# Patient Record
Sex: Male | Born: 1990 | Race: Black or African American | Hispanic: No | Marital: Single | Smoking: Current every day smoker
Health system: Southern US, Community
[De-identification: ages and names within clinical notes are randomized; demographics above are authoritative.]

---
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
## 2012-10-18 ENCOUNTER — Encounter (HOSPITAL_COMMUNITY): Payer: Self-pay | Admitting: *Deleted

## 2012-10-18 ENCOUNTER — Observation Stay (HOSPITAL_COMMUNITY)
Admission: EM | Admit: 2012-10-18 | Discharge: 2012-10-18 | Disposition: A | Discharge status: Home / Self Care | Payer: MEDICAID | Attending: Surgery | Admitting: Surgery

## 2012-10-18 ENCOUNTER — Emergency Department (HOSPITAL_COMMUNITY): Payer: Self-pay

## 2012-10-18 DIAGNOSIS — S31109A Unspecified open wound of abdominal wall, unspecified quadrant without penetration into peritoneal cavity, initial encounter: Secondary | ICD-10-CM

## 2012-10-18 DIAGNOSIS — K921 Melena: Secondary | ICD-10-CM | POA: Insufficient documentation

## 2012-10-18 DIAGNOSIS — S31119A Laceration without foreign body of abdominal wall, unspecified quadrant without penetration into peritoneal cavity, initial encounter: Secondary | ICD-10-CM

## 2012-10-18 LAB — COMPREHENSIVE METABOLIC PANEL
ALT: 27 U/L (ref 0–53)
Albumin: 4.1 g/dL (ref 3.5–5.2)
Alkaline Phosphatase: 72 U/L (ref 39–117)
BUN: 13 mg/dL (ref 6–23)
CO2: 26 mEq/L (ref 19–32)
Calcium: 9.3 mg/dL (ref 8.4–10.5)
Creatinine, Ser: 1.14 mg/dL (ref 0.50–1.35)
GFR calc Af Amer: >90 mL/min (ref >90)
Glucose, Bld: 87 mg/dL (ref 70–99)
Sodium: 141 mEq/L (ref 135–145)
Total Bilirubin: 0.8 mg/dL (ref 0.3–1.2)

## 2012-10-18 LAB — CBC
MCV: 88.4 fL (ref 78.0–100.0)
RDW: 13.5 % (ref 11.5–15.5)

## 2012-10-18 LAB — POCT I-STAT, CHEM 8
Calcium, Ion: 1.18 mmol/L (ref 1.12–1.23)
Glucose, Bld: 90 mg/dL (ref 70–99)
Hemoglobin: 14.6 g/dL (ref 13.0–17.0)
Sodium: 145 mEq/L (ref 135–145)

## 2012-10-18 MED ORDER — ONDANSETRON HCL 4 MG/2ML IJ SOLN: 4.0000 mg | Freq: Four times a day (QID) | INTRAMUSCULAR | Status: DC | PRN

## 2012-10-18 MED ORDER — MORPHINE SULFATE 2 MG/ML IJ SOLN: 2.0000 mg | INTRAMUSCULAR | Status: DC | PRN

## 2012-10-18 MED ORDER — ONDANSETRON HCL 4 MG PO TABS: 4.0000 mg | ORAL_TABLET | Freq: Four times a day (QID) | ORAL | Status: DC | PRN

## 2012-10-18 MED ORDER — BACITRACIN-NEOMYCIN-POLYMYXIN OINTMENT TUBE
TOPICAL_OINTMENT | Freq: Two times a day (BID) | CUTANEOUS | Status: DC
  Filled 2012-10-18 (×2): qty 15

## 2012-10-18 MED ORDER — SODIUM CHLORIDE 0.9 % IV SOLN: INTRAVENOUS | Status: DC
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
## 2012-10-18 NOTE — ED Notes (Signed)
Report given to Amanda, RN
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
## 2013-04-27 ENCOUNTER — Emergency Department (HOSPITAL_COMMUNITY)
Admission: EM | Admit: 2013-04-27 | Discharge: 2013-04-27 | Disposition: A | Discharge status: Home / Self Care | Payer: Self-pay | Attending: Emergency Medicine | Admitting: Emergency Medicine

## 2013-04-27 ENCOUNTER — Encounter (HOSPITAL_COMMUNITY): Payer: Self-pay | Admitting: Emergency Medicine

## 2013-04-27 ENCOUNTER — Emergency Department (HOSPITAL_COMMUNITY): Admission: EM | Admit: 2013-04-27 | Discharge: 2013-04-27 | Discharge status: Against Medical Advice | Payer: Self-pay

## 2013-04-27 DIAGNOSIS — S0181XA Laceration without foreign body of other part of head, initial encounter: Secondary | ICD-10-CM

## 2013-04-27 DIAGNOSIS — Y92009 Unspecified place in unspecified non-institutional (private) residence as the place of occurrence of the external cause: Secondary | ICD-10-CM | POA: Insufficient documentation

## 2013-04-27 DIAGNOSIS — S01409A Unspecified open wound of unspecified cheek and temporomandibular area, initial encounter: Secondary | ICD-10-CM | POA: Insufficient documentation

## 2013-04-27 DIAGNOSIS — W01119A Fall on same level from slipping, tripping and stumbling with subsequent striking against unspecified sharp object, initial encounter: Secondary | ICD-10-CM | POA: Insufficient documentation

## 2013-04-27 DIAGNOSIS — Y9383 Activity, rough housing and horseplay: Secondary | ICD-10-CM | POA: Insufficient documentation

## 2013-04-27 DIAGNOSIS — F17200 Nicotine dependence, unspecified, uncomplicated: Secondary | ICD-10-CM | POA: Insufficient documentation

## 2013-04-27 MED ORDER — LIDOCAINE HCL (PF) 2 % IJ SOLN
10.0000 mL | Freq: Once | INTRAMUSCULAR | Status: AC
  Administered 2013-04-27: 10 mL via INTRADERMAL

## 2013-04-27 MED ORDER — LIDOCAINE HCL (PF) 2 % IJ SOLN
10.0000 mL | Freq: Once | INTRAMUSCULAR | Status: DC
  Filled 2013-04-27: qty 10

## 2013-04-27 MED ORDER — LIDOCAINE HCL (PF) 2 % IJ SOLN
INTRAMUSCULAR | Status: AC
  Filled 2013-04-27: qty 10

## 2013-04-27 NOTE — ED Notes (Signed)
Charting completed on wrong patient due to being given incorrect name by patient.

## 2013-04-27 NOTE — ED Provider Notes (Addendum)
CSN: [id number] Arrival date & time 04/27/13 0201   History   First MD Initiated Contact with Patient 04/27/13 0216  Chief Complaint   Patient presents with   .  Facial Laceration    HPI  Reports he was playing at the house, fell and struck his right cheek on the couch and a nail was sticking out. Reports laceration. No bleeding at this time. No trismus or malocclusion. Symptoms are mild. No neck pain. No head injury no LOC. Tetanus updated less than 5 years ago.  History reviewed. No pertinent past medical history.  History reviewed. No pertinent past surgical history.  No family history on file.  History   Substance Use Topics   .  Smoking status:  Current Every Day Smoker   .  Smokeless tobacco:  Not on file   .  Alcohol Use:  No     Review of Systems  All other systems reviewed and are negative.   Allergies   Review of patient's allergies indicates no known allergies.  Home Medications   No current outpatient prescriptions on file.  BP 131/81  Pulse 62  Temp(Src) 98.8 F (37.1 C) (Oral)  Resp 20  Ht 6\' 3"  (1.905 m)  Wt 170 lb (77.111 kg)  BMI 21.25 kg/m2  SpO2 100%  Physical Exam  Nursing note and vitals reviewed.  Constitutional: He is oriented to person, place, and time. He appears well-developed and well-nourished.  HENT:  Head: Normocephalic and atraumatic.  No underlying dental injury. Laceration to right cheek near the angle of his mouth. No Vermillion border involvement. No trismus or malocculsion. Laceration is through and through with intraoral component  Eyes: EOM are normal.  Neck: Normal range of motion.  Cardiovascular: Normal rate, regular rhythm, normal heart sounds and intact distal pulses.  Pulmonary/Chest: Effort normal and breath sounds normal. No respiratory distress.  Abdominal: Soft. He exhibits no distension. There is no tenderness.  Musculoskeletal: Normal range of motion.  Neurological: He is alert and oriented to person, place, and time.   Skin: Skin is warm and dry.  Psychiatric: He has a normal mood and affect. Judgment normal.   ED Course   Procedures (including critical care time)  Labs Review  Labs Reviewed - No data to display  Imaging Review  No results found.  EKG Interpretation    None      MDM    1.  Facial laceration    Laceration repaired. Tetanus UTD. No indication for imaging. No other injury. c spine cleared by nexus criteria.  Lyanne CoKevin M Tiara Bartoli, MD  04/27/13 [phone]   LACERATION REPAIR Performed by: Lyanne CoAMPOS,Cristianna Cyr M Consent: Verbal consent obtained. Risks and benefits: risks, benefits and alternatives were discussed Patient identity confirmed: provided demographic data Time out performed prior to procedure Prepped and Draped in normal sterile fashion Wound explored Laceration Location: right cheek Laceration Length: 3cm No Foreign Bodies seen or palpated Anesthesia: local infiltration Local anesthetic: lidocaine 2% with epinephrine Anesthetic total: 7 ml Irrigation method: syringe Amount of cleaning: standard Skin closure: 5-0 Prolene  Number of sutures or staples: 6  Technique: Running interlocked  Patient tolerance: Patient tolerated the procedure well with no immediate complications.  LACERATION REPAIR Performed by: Lyanne CoAMPOS,Verdean Murin M Consent: Verbal consent obtained. Risks and benefits: risks, benefits and alternatives were discussed Patient identity confirmed: provided demographic data Time out performed prior to procedure Prepped and Draped in normal sterile fashion Wound explored Laceration Location: intraoral Laceration Length: 1cm No Foreign Bodies seen or palpated  Anesthesia: local infiltration Local anesthetic: lidocaine 2% with epinephrine Anesthetic total: 4 ml Irrigation method: syringe Amount of cleaning: standard Skin closure: 4-0 chromic Number of sutures or staples: 1 Technique: simple interrupted  Patient tolerance: Patient tolerated the procedure well with no  immediate complications.    Lyanne Co, MD 04/27/13 1610  Lyanne Co, MD 05/04/13 [phone]

## 2013-04-27 NOTE — ED Notes (Signed)
MD at bedside. 

## 2013-04-27 NOTE — ED Notes (Signed)
Patient states was horseplaying and landed on a nail that was sticking out of edge of couch.  Patient has puncture wound noted right side of lip.

## 2013-04-27 NOTE — ED Provider Notes (Deleted)
CSN: [id number]     Arrival date & time 04/27/13  0201 History   First MD Initiated Contact with Patient 04/27/13 0216     Chief Complaint  Patient presents with  . Facial Laceration    HPI Reports he was playing at the house, fell and struck his right cheek on the couch and a nail was sticking out. Reports laceration. No bleeding at this time. No trismus or malocclusion. Symptoms are mild. No neck pain. No head injury no LOC. Tetanus updated less than 5 years ago.    History reviewed. No pertinent past medical history. History reviewed. No pertinent past surgical history. No family history on file. History  Substance Use Topics  . Smoking status: Current Every Day Smoker  . Smokeless tobacco: Not on file  . Alcohol Use: No    Review of Systems  All other systems reviewed and are negative.    Allergies  Review of patient's allergies indicates no known allergies.  Home Medications  No current outpatient prescriptions on file. BP 131/81  Pulse 62  Temp(Src) 98.8 F (37.1 C) (Oral)  Resp 20  Ht 6\' 3"  (1.905 m)  Wt 170 lb (77.111 kg)  BMI 21.25 kg/m2  SpO2 100% Physical Exam  Nursing note and vitals reviewed. Constitutional: He is oriented to person, place, and time. He appears well-developed and well-nourished.  HENT:  Head: Normocephalic and atraumatic.  No underlying dental injury. Laceration to right cheek near the angle of his mouth. No Vermillion border involvement. No trismus or malocculsion. Laceration is through and through with intraoral component  Eyes: EOM are normal.  Neck: Normal range of motion.  Cardiovascular: Normal rate, regular rhythm, normal heart sounds and intact distal pulses.   Pulmonary/Chest: Effort normal and breath sounds normal. No respiratory distress.  Abdominal: Soft. He exhibits no distension. There is no tenderness.  Musculoskeletal: Normal range of motion.  Neurological: He is alert and oriented to person, place, and time.  Skin:  Skin is warm and dry.  Psychiatric: He has a normal mood and affect. Judgment normal.    ED Course  Procedures (including critical care time) Labs Review Labs Reviewed - No data to display Imaging Review No results found.  EKG Interpretation   None       MDM   1. Facial laceration    Laceration repaired. Tetanus UTD. No indication for imaging. No other injury. c spine cleared by nexus criteria.     Lyanne CoKevin M Osten Janek, MD 04/27/13 [phone]

## 2013-08-11 ENCOUNTER — Encounter (HOSPITAL_COMMUNITY): Payer: Self-pay | Admitting: Emergency Medicine

## 2013-08-11 ENCOUNTER — Emergency Department (HOSPITAL_COMMUNITY)
Admission: EM | Admit: 2013-08-11 | Discharge: 2013-08-11 | Disposition: A | Discharge status: Home / Self Care | Payer: Self-pay | Attending: Emergency Medicine | Admitting: Emergency Medicine

## 2013-08-11 DIAGNOSIS — R369 Urethral discharge, unspecified: Secondary | ICD-10-CM | POA: Insufficient documentation

## 2013-08-11 DIAGNOSIS — F17200 Nicotine dependence, unspecified, uncomplicated: Secondary | ICD-10-CM | POA: Insufficient documentation

## 2013-08-11 DIAGNOSIS — R300 Dysuria: Secondary | ICD-10-CM | POA: Insufficient documentation

## 2013-08-11 DIAGNOSIS — N489 Disorder of penis, unspecified: Secondary | ICD-10-CM | POA: Insufficient documentation

## 2013-08-11 DIAGNOSIS — Z7251 High risk heterosexual behavior: Secondary | ICD-10-CM

## 2013-08-11 DIAGNOSIS — R599 Enlarged lymph nodes, unspecified: Secondary | ICD-10-CM | POA: Insufficient documentation

## 2013-08-11 DIAGNOSIS — Z202 Contact with and (suspected) exposure to infections with a predominantly sexual mode of transmission: Secondary | ICD-10-CM | POA: Insufficient documentation

## 2013-08-11 LAB — URINALYSIS, ROUTINE W REFLEX MICROSCOPIC
Bilirubin Urine: NEGATIVE
Glucose, UA: NEGATIVE mg/dL
KETONES UR: NEGATIVE mg/dL
NITRITE: NEGATIVE
Protein, ur: NEGATIVE mg/dL
SPECIFIC GRAVITY, URINE: 1.019 (ref 1.005–1.030)
UROBILINOGEN UA: 0.2 mg/dL (ref 0.0–1.0)
pH: 7 (ref 5.0–8.0)

## 2013-08-11 LAB — URINE MICROSCOPIC-ADD ON

## 2013-08-11 MED ORDER — AZITHROMYCIN 250 MG PO TABS
1000.0000 mg | ORAL_TABLET | Freq: Once | ORAL | Status: AC
  Administered 2013-08-11: 1000 mg via ORAL
  Filled 2013-08-11: qty 4

## 2013-08-11 MED ORDER — LIDOCAINE HCL (PF) 1 % IJ SOLN
INTRAMUSCULAR | Status: AC
  Administered 2013-08-11: 0.9 mL
  Filled 2013-08-11: qty 5

## 2013-08-11 MED ORDER — CEFTRIAXONE SODIUM 250 MG IJ SOLR
250.0000 mg | Freq: Once | INTRAMUSCULAR | Status: AC
  Administered 2013-08-11: 250 mg via INTRAMUSCULAR
  Filled 2013-08-11: qty 250

## 2013-08-11 NOTE — ED Notes (Signed)
Pt. Stated, 2 days it started a burning on urination, I've had it before so I know the symptoms.

## 2013-08-11 NOTE — ED Provider Notes (Signed)
Medical screening examination/treatment/procedure(s) were performed by non-physician practitioner and as supervising physician I was immediately available for consultation/collaboration.   EKG Interpretation None        Dagmar HaitWilliam Heidi Lemay, MD 08/11/13 2303

## 2013-08-11 NOTE — ED Notes (Signed)
Pt went to [facility] and then left.  Pt has discharge papers.

## 2013-08-11 NOTE — Discharge Instructions (Signed)
You have been treated today for both gonorrhea and Chlamydia. Followup on the results of your gonorrhea and Chlamydia culture on Monday with the health department. Do not engage in sexual intercourse for at least one week. Also do not have sex if your partner has not been tested or treated for STDs. You must notify all partners need to have been tested and treated for STDs today so they may also be tested and treated. Followup with your primary care doctor as needed. Return if symptoms worsen.  Safe Sex Safe sex is about reducing the risk of giving or getting a sexually transmitted disease (STD). STDs are spread through sexual contact involving the genitals, mouth, or rectum. Some STDS can be cured and others cannot. Safe sex can also prevent unintended pregnancies.  SAFE SEX PRACTICES  Limit your sexual activity to only one partner who is only having sex with you.  Talk to your partner about their past partners, past STDs, and drug use.  Use a condom every time you have sexual intercourse. This includes vaginal, oral, and anal sexual activity. Both females and males should wear condoms during oral sex. Only use latex or polyurethane condoms and water-based lubricants. Petroleum-based lubricants or oils used to lubricate a condom will weaken the condom and increase the chance that it will break. The condom should be in place from the beginning to the end of sexual activity. Wearing a condom reduces, but does not completely eliminate, your risk of getting or giving a STD. STDs can be spread by contact with skin of surrounding areas.  Get vaccinated for hepatitis B and HPV.  Avoid alcohol and recreational drugs which can affect your judgement. You may forget to use a condom or participate in high-risk sex.  For females, avoid douching after sexual intercourse. Douching can spread an infection farther into the reproductive tract.  Check your body for signs of sores, blisters, rashes, or unusual  discharge. See your caregiver if you notice any of these signs.  Avoid sexual contact if you have symptoms of an infection or are being treated for an STD. If you or your partner has herpes, avoid sexual contact when blisters are present. Use condoms at all other times.  See your caregiver for regular screenings, examinations, and tests for STDs. Before having sex with a new partner, each of you should be screened for STDs and talk about the results with your partner. BENEFITS OF SAFE SEX   There is less of a chance of getting or giving an STD.  You can prevent unwanted or unintended pregnancies.  By discussing safer sex concerns with your partner, you may increase feelings of intimacy, comfort, trust, and honesty between the both of you. Document Released: 04/29/2004 Document Revised: 12/15/2011 Document Reviewed: 09/13/2011 [facility] Patient Information 2014 [place], [place].

## 2013-08-11 NOTE — ED Provider Notes (Signed)
CSN: [id number]     Arrival date & time 08/11/13  1406 History  This chart was scribed for non-physician practitioner working with Dagmar HaitWilliam Blair Walden, MD by Ashley JacobsBrittany Andrews, ED scribe. This patient was seen in room TR06C/TR06C and the patient's care was started at 3:46 PM.   First MD Initiated Contact with Patient 08/11/13 1513     Chief Complaint  Patient presents with  . SEXUALLY TRANSMITTED DISEASE    (Consider location/radiation/quality/duration/timing/severity/associated sxs/prior Treatment) Patient is a 23 y.o. male presenting with STD exposure. The history is provided by the patient and medical records. No language interpreter was used.  Exposure to STD This is a recurrent problem. The current episode started 2 days ago. The problem occurs constantly. The problem has not changed since onset.Nothing relieves the symptoms. He has tried nothing for the symptoms.   HPI Comments: Jeffery Escobar is a 23 y.o. male who presents to the Emergency Department complaining STD concerns. Pt has burning while urinating that started two days. Pt has a greenish  Penile discharge. He had unprotected intercourse. Pt had sexual intercourse with multiple partner but he denies having unprotected intercourse with any other person. Pt had a prior STD. He has a pain at the tip of his penis.  Denies any other urinary symptom. Denies nausea and fever. Nothing seems to make his symptoms better. Denies rash.   History reviewed. No pertinent past medical history. History reviewed. No pertinent past surgical history. No family history on file. History  Substance Use Topics  . Smoking status: Current Every Day Smoker  . Smokeless tobacco: Not on file  . Alcohol Use: No    Review of Systems  Constitutional: Negative for fever.  Gastrointestinal: Negative for nausea.  Genitourinary: Positive for dysuria, discharge, difficulty urinating and penile pain. Negative for hematuria, penile swelling, scrotal swelling,  genital sores and testicular pain.  Skin: Negative for rash.  All other systems reviewed and are negative.    Allergies  Review of patient's allergies indicates no known allergies.  Home Medications   Prior to Admission medications   Not on File   BP 98/54  Pulse 66  Temp(Src) 98.3 F (36.8 C) (Oral)  Resp 17  Ht 6\' 3"  (1.905 m)  Wt 170 lb 8 oz (77.338 kg)  BMI 21.31 kg/m2  SpO2 97%  Physical Exam  Nursing note and vitals reviewed. Constitutional: He is oriented to person, place, and time. He appears well-developed and well-nourished. No distress.  HENT:  Head: Normocephalic and atraumatic.  Eyes: Conjunctivae and EOM are normal. No scleral icterus.  Neck: Normal range of motion.  Cardiovascular: Normal rate, regular rhythm and intact distal pulses.   Distal radial pulse 2+ in right upper extremity  Pulmonary/Chest: Effort normal. No respiratory distress.  Abdominal: Soft. He exhibits no distension. There is no tenderness. Hernia confirmed negative in the right inguinal area and confirmed negative in the left inguinal area.  Genitourinary: Testes normal. Right testis shows no mass, no swelling and no tenderness. Right testis is descended. Left testis shows no mass, no swelling and no tenderness. Left testis is descended. Circumcised. No penile erythema or penile tenderness. Discharge found.  Chaperoned GU exam significant for yellow, opaque penile discharge as well as bilateral mild tender inguinal lymphadenopathy. No lesions, penile tenderness, or testicular tenderness appreciated.  Musculoskeletal: Normal range of motion.  Lymphadenopathy:       Right: Inguinal (Mildly tender) adenopathy present.       Left: Inguinal (Mildly tender) adenopathy present.  Neurological: He is alert and oriented to person, place, and time.  No gross sensory deficits appreciated. Patient moves extremities without ataxia.  Skin: Skin is warm and dry. No rash noted. He is not diaphoretic. No  erythema. No pallor.  Psychiatric: He has a normal mood and affect. His behavior is normal.    ED Course  Procedures (including critical care time) DIAGNOSTIC STUDIES: Oxygen Saturation is 97% on room air, normal by my interpretation.    COORDINATION OF CARE:  3:49 PM Discussed course of care with pt . Pt understands and agrees.  Labs Review Labs Reviewed  GC/CHLAMYDIA PROBE AMP  URINALYSIS, ROUTINE W REFLEX MICROSCOPIC  RPR  HIV ANTIBODY (ROUTINE TESTING)   Imaging Review No results found.   EKG Interpretation None      MDM   Final diagnoses:  Penile discharge  Unprotected sexual intercourse    Patient to be discharged with instructions to follow up with [place] health dept. Discussed importance of using protection when sexually active. Pt understands that they have GC/Chlamydia cultures pending and that they will need to inform all sexual partners if results return positive. Pt has been treated prophylacticly with azithromycin and rocephin due to pts history, GU exam, and UA with increased WBCs. Pt hemodynamically stable and abdominal TTP. He is stable for discharge with necessary return precautions. Patient agreeable to plan with no unaddressed concerns.  I personally performed the services described in this documentation, which was scribed in my presence. The recorded information has been reviewed and is accurate.      Antony MaduraKelly Demarus Latterell, PA-C 08/11/13 1627

## 2013-08-12 LAB — HIV ANTIBODY (ROUTINE TESTING W REFLEX): HIV 1&2 Ab, 4th Generation: NONREACTIVE

## 2013-08-12 LAB — RPR

## 2013-08-13 LAB — GC/CHLAMYDIA PROBE AMP
CT Probe RNA: NEGATIVE
GC Probe RNA: POSITIVE — AB

## 2013-08-14 ENCOUNTER — Telehealth (HOSPITAL_BASED_OUTPATIENT_CLINIC_OR_DEPARTMENT_OTHER): Payer: Self-pay | Admitting: Emergency Medicine

## 2013-08-14 NOTE — Telephone Encounter (Signed)
+   gonorrhea, treated with rocephin and zithromax. Will contact patient. [employer] faxed

## 2013-08-19 ENCOUNTER — Inpatient Hospital Stay (HOSPITAL_COMMUNITY)
Admission: EM | Admit: 2013-08-19 | Discharge: 2013-08-20 | DRG: 159 | Disposition: A | Discharge status: Home / Self Care | Payer: Self-pay | Attending: General Surgery | Admitting: General Surgery

## 2013-08-19 ENCOUNTER — Emergency Department (HOSPITAL_COMMUNITY): Payer: Self-pay

## 2013-08-19 ENCOUNTER — Encounter (HOSPITAL_COMMUNITY): Payer: Self-pay | Admitting: Emergency Medicine

## 2013-08-19 DIAGNOSIS — R0681 Apnea, not elsewhere classified: Secondary | ICD-10-CM | POA: Diagnosis present

## 2013-08-19 DIAGNOSIS — I498 Other specified cardiac arrhythmias: Secondary | ICD-10-CM | POA: Diagnosis present

## 2013-08-19 DIAGNOSIS — H05229 Edema of unspecified orbit: Secondary | ICD-10-CM | POA: Diagnosis present

## 2013-08-19 DIAGNOSIS — S02400A Malar fracture unspecified, initial encounter for closed fracture: Principal | ICD-10-CM | POA: Diagnosis present

## 2013-08-19 DIAGNOSIS — S02401A Maxillary fracture, unspecified, initial encounter for closed fracture: Principal | ICD-10-CM | POA: Diagnosis present

## 2013-08-19 DIAGNOSIS — S0180XA Unspecified open wound of other part of head, initial encounter: Secondary | ICD-10-CM | POA: Diagnosis present

## 2013-08-19 DIAGNOSIS — F17200 Nicotine dependence, unspecified, uncomplicated: Secondary | ICD-10-CM | POA: Diagnosis present

## 2013-08-19 DIAGNOSIS — S0292XA Unspecified fracture of facial bones, initial encounter for closed fracture: Secondary | ICD-10-CM | POA: Diagnosis present

## 2013-08-19 LAB — SAMPLE TO BLOOD BANK

## 2013-08-19 LAB — COMPREHENSIVE METABOLIC PANEL
ALT: 19 U/L (ref 0–53)
AST: 31 U/L (ref 0–37)
Albumin: 4 g/dL (ref 3.5–5.2)
Alkaline Phosphatase: 60 U/L (ref 39–117)
BUN: 8 mg/dL (ref 6–23)
CO2: 23 mEq/L (ref 19–32)
CREATININE: 1.16 mg/dL (ref 0.50–1.35)
Calcium: 8.9 mg/dL (ref 8.4–10.5)
Chloride: 102 mEq/L (ref 96–112)
GFR calc Af Amer: >90 mL/min (ref >90)
GFR calc non Af Amer: 89 mL/min — ABNORMAL LOW (ref >90)
Glucose, Bld: 141 mg/dL — ABNORMAL HIGH (ref 70–99)
Potassium: 3.4 mEq/L — ABNORMAL LOW (ref 3.7–5.3)
Sodium: 140 mEq/L (ref 137–147)
TOTAL PROTEIN: 6.9 g/dL (ref 6.0–8.3)
Total Bilirubin: 0.6 mg/dL (ref 0.3–1.2)

## 2013-08-19 LAB — CDS SEROLOGY

## 2013-08-19 LAB — PROTIME-INR
INR: 0.99 (ref 0.00–1.49)
Prothrombin Time: 12.9 seconds (ref 11.6–15.2)

## 2013-08-19 LAB — CBC
HEMATOCRIT: 37.9 % — AB (ref 39.0–52.0)
Hemoglobin: 13 g/dL (ref 13.0–17.0)
MCH: 31.2 pg (ref 26.0–34.0)
MCHC: 34.3 g/dL (ref 30.0–36.0)
MCV: 90.9 fL (ref 78.0–100.0)
Platelets: 297 10*3/uL (ref 150–400)
RBC: 4.17 MIL/uL — AB (ref 4.22–5.81)
RDW: 12.9 % (ref 11.5–15.5)
WBC: 9.4 10*3/uL (ref 4.0–10.5)

## 2013-08-19 LAB — ETHANOL: Alcohol, Ethyl (B): <11 mg/dL (ref 0–11)

## 2013-08-19 MED ORDER — CEFAZOLIN SODIUM 1-5 GM-% IV SOLN
1.0000 g | Freq: Once | INTRAVENOUS | Status: AC
  Administered 2013-08-20: 1 g via INTRAVENOUS
  Filled 2013-08-19: qty 50

## 2013-08-19 MED ORDER — ONDANSETRON HCL 4 MG/2ML IJ SOLN
INTRAMUSCULAR | Status: AC
  Administered 2013-08-19: 4 mg via INTRAVENOUS
  Filled 2013-08-19: qty 2

## 2013-08-19 MED ORDER — TETANUS-DIPHTH-ACELL PERTUSSIS 5-2.5-18.5 LF-MCG/0.5 IM SUSP
0.5000 mL | Freq: Once | INTRAMUSCULAR | Status: AC
  Administered 2013-08-20: 0.5 mL via INTRAMUSCULAR
  Filled 2013-08-19: qty 0.5

## 2013-08-19 MED ORDER — IOHEXOL 300 MG/ML  SOLN
100.0000 mL | Freq: Once | INTRAMUSCULAR | Status: AC | PRN
  Administered 2013-08-19: 100 mL via INTRAVENOUS

## 2013-08-19 MED ORDER — FENTANYL CITRATE 0.05 MG/ML IJ SOLN
50.0000 ug | Freq: Once | INTRAMUSCULAR | Status: AC
  Administered 2013-08-19: 50 ug via INTRAVENOUS
  Filled 2013-08-19: qty 2
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
## 2013-08-20 DIAGNOSIS — IMO0002 Reserved for concepts with insufficient information to code with codable children: Secondary | ICD-10-CM

## 2013-08-20 DIAGNOSIS — S0292XA Unspecified fracture of facial bones, initial encounter for closed fracture: Secondary | ICD-10-CM | POA: Diagnosis present

## 2013-08-20 MED ORDER — PANTOPRAZOLE SODIUM 40 MG IV SOLR
40.0000 mg | Freq: Every day | INTRAVENOUS | Status: DC
  Administered 2013-08-20: 40 mg via INTRAVENOUS
  Filled 2013-08-20: qty 40

## 2013-08-20 MED ORDER — ONDANSETRON HCL 4 MG/2ML IJ SOLN: 4.0000 mg | Freq: Four times a day (QID) | INTRAMUSCULAR | Status: DC | PRN

## 2013-08-20 MED ORDER — ACETAMINOPHEN 325 MG PO TABS
650.0000 mg | ORAL_TABLET | Freq: Once | ORAL | Status: AC
  Administered 2013-08-20: 650 mg via ORAL
  Filled 2013-08-20: qty 2

## 2013-08-20 MED ORDER — PANTOPRAZOLE SODIUM 40 MG PO TBEC
40.0000 mg | DELAYED_RELEASE_TABLET | Freq: Every day | ORAL | Status: DC
  Filled 2013-08-20: qty 1

## 2013-08-20 MED ORDER — ONDANSETRON HCL 4 MG/2ML IJ SOLN
4.0000 mg | Freq: Once | INTRAMUSCULAR | Status: AC
  Administered 2013-08-19: 4 mg via INTRAVENOUS

## 2013-08-20 MED ORDER — SODIUM CHLORIDE 0.9 % IV BOLUS (SEPSIS)
1000.0000 mL | Freq: Once | INTRAVENOUS | Status: AC
  Administered 2013-08-20: 1000 mL via INTRAVENOUS

## 2013-08-20 MED ORDER — ONDANSETRON HCL 4 MG PO TABS: 4.0000 mg | ORAL_TABLET | Freq: Four times a day (QID) | ORAL | Status: DC | PRN

## 2013-08-20 MED ORDER — HYDROCODONE-ACETAMINOPHEN 5-325 MG PO TABS: 1.0000 | ORAL_TABLET | Freq: Four times a day (QID) | ORAL | Status: AC | PRN

## 2013-08-20 MED ORDER — HYDROCODONE-ACETAMINOPHEN 5-325 MG PO TABS
1.0000 | ORAL_TABLET | Freq: Four times a day (QID) | ORAL | Status: DC | PRN
  Administered 2013-08-20: 2 via ORAL
  Filled 2013-08-20 (×2): qty 2

## 2013-08-20 MED ORDER — SODIUM CHLORIDE 0.9 % IV SOLN
INTRAVENOUS | Status: DC
  Administered 2013-08-20: 10:00:00 via INTRAVENOUS

## 2013-08-20 NOTE — Discharge Instructions (Signed)
Apply ice to face as needed  You may take vicodin for pain as needed for pain.  Please do not drive or operate any machinery while taking this medication.  You may also take tylenol and ibuprofen which will help with pain and swelling.  Please call with questions or concerns.  You do not need to follow up in the trauma office.  Be sure to call Dr. Ina Homeseoh(ENT doctor) for a follow up in 1 week regarding your facial fractures.
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
## 2013-08-20 NOTE — ED Notes (Signed)
Pt sutures to L forehead and L chin with scant slow active bleeding present. MD Dierdre Highmanpitz informed. MD states to apply ice, pressure and elevate. HOB has already been elevated. Ice packs applied. Pt headache 10/10. Tylenol verbal order obtained for pain due to pt bradycardia and occasional bradypnea.
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
## 2013-08-30 ENCOUNTER — Ambulatory Visit (INDEPENDENT_AMBULATORY_CARE_PROVIDER_SITE_OTHER): Payer: Self-pay | Admitting: Otolaryngology

## 2013-08-30 DIAGNOSIS — S0280XA Fracture of other specified skull and facial bones, unspecified side, initial encounter for closed fracture: Secondary | ICD-10-CM

## 2014-11-08 ENCOUNTER — Encounter (HOSPITAL_COMMUNITY): Payer: Self-pay | Admitting: Emergency Medicine

## 2014-11-08 ENCOUNTER — Emergency Department (HOSPITAL_COMMUNITY)
Admission: EM | Admit: 2014-11-08 | Discharge: 2014-11-08 | Disposition: A | Discharge status: Home / Self Care | Payer: Self-pay | Attending: Emergency Medicine | Admitting: Emergency Medicine

## 2014-11-08 DIAGNOSIS — L237 Allergic contact dermatitis due to plants, except food: Secondary | ICD-10-CM | POA: Insufficient documentation

## 2014-11-08 DIAGNOSIS — Z720 Tobacco use: Secondary | ICD-10-CM | POA: Insufficient documentation

## 2014-11-08 MED ORDER — HYDROXYZINE HCL 25 MG PO TABS: 25.0000 mg | ORAL_TABLET | Freq: Four times a day (QID) | ORAL | Status: AC

## 2014-11-08 MED ORDER — PREDNISONE 10 MG (21) PO TBPK: 10.0000 mg | ORAL_TABLET | Freq: Every day | ORAL | Status: AC

## 2014-11-08 NOTE — ED Provider Notes (Signed)
CSN: [id number]     Arrival date & time 11/08/14  1459 History  This chart was scribed for non-physician practitioner, Langston Masker, PA-C working with Lyndal Pulley, MD, by Jarvis Morgan, ED Scribe. This patient was seen in room WTR6/WTR6 and the patient's care was started at 3:22 PM.    Chief Complaint  Patient presents with  . Rash    possible poison ivy to bilat arms, neck/face x4 days ago, after running through the woods    Patient is a 24 y.o. male presenting with rash. The history is provided by the patient. No language interpreter was used.  Rash Location:  Shoulder/arm and head/neck Head/neck rash location:  Head, R neck and L neck Shoulder/arm rash location:  L arm and R arm Quality: burning, itchiness and redness   Severity:  Mild Onset quality:  Gradual Duration:  4 days Timing:  Constant Progression:  Spreading Chronicity:  New Context: plant contact (poison ivy)   Relieved by:  None tried Worsened by:  Nothing tried Ineffective treatments:  None tried Associated symptoms: no shortness of breath, no sore throat, no throat swelling, no tongue swelling and not wheezing     HPI Comments: Jeffery Escobar is a 24 y.o. male who presents to the Emergency Department complaining of a red, itchy, burning, gradually worsening, rash to bilateral arms onset 4 days ago. Pt states the rash has also spread to his neck and face. He reports the rash occurred after running through the woods 4 days ago. He denies any drainage from the rash, SOB or facial swelling.  History reviewed. No pertinent past medical history. History reviewed. No pertinent past surgical history. No family history on file. History  Substance Use Topics  . Smoking status: Current Every Day Smoker  . Smokeless tobacco: Not on file  . Alcohol Use: No    Review of Systems  HENT: Negative for facial swelling and sore throat.   Respiratory: Negative for cough, shortness of breath and wheezing.   Skin: Positive for  rash.  All other systems reviewed and are negative.     Allergies  Bee venom  Home Medications   Prior to Admission medications   Medication Sig Start Date End Date Taking? Authorizing Provider  HYDROcodone-acetaminophen (NORCO/VICODIN) 5-325 MG per tablet Take 1 tablet by mouth every 6 (six) hours as needed for moderate pain or severe pain. 08/20/13   Ashok Norris, NP   Triage Vitals: BP 111/69 mmHg  Pulse 57  Temp(Src) 98.3 F (36.8 C) (Oral)  Resp 12  Ht  (1.905 m)  Wt 171 lb (77.565 kg)  BMI 21.37 kg/m2  SpO2 99%  Physical Exam  Constitutional: He is oriented to person, place, and time. He appears well-developed and well-nourished. No distress.  HENT:  Head: Normocephalic and atraumatic.  Eyes: Conjunctivae and EOM are normal.  Neck: Neck supple. No tracheal deviation present.  Cardiovascular: Normal rate.   Pulmonary/Chest: Effort normal. No respiratory distress.  Musculoskeletal: Normal range of motion.  Neurological: He is alert and oriented to person, place, and time.  Skin: Skin is warm and dry. Rash noted. There is erythema.  Multiple fine, linear macular papular rash. Multiple fine papules  Psychiatric: He has a normal mood and affect. His behavior is normal.  Nursing note and vitals reviewed.   ED Course  Procedures (including critical care time)  DIAGNOSTIC STUDIES: Oxygen Saturation is 99% on RA, normal by my interpretation.    COORDINATION OF CARE:    Labs Review Labs  Reviewed - No data to display  Imaging Review No results found.   EKG Interpretation None      MDM   Final diagnoses:  Poison ivy   Meds ordered this encounter  Medications  . predniSONE (STERAPRED UNI-PAK 21 TAB) 10 MG (21) TBPK tablet    Sig: Take 1 tablet (10 mg total) by mouth daily. Take 6 tabs by mouth daily  for 2 days, then 5 tabs for 2 days, then 4 tabs for 2 days, then 3 tabs for 2 days, 2 tabs for 2 days, then 1 tab by mouth daily for 2 days     Dispense:  42 tablet    Refill:  0    Order Specific Question:  Supervising Provider    Answer:  Hyacinth Meeker, BRIAN [[id number]]  . hydrOXYzine (ATARAX/VISTARIL) 25 MG tablet    Sig: Take 1 tablet (25 mg total) by mouth every 6 (six) hours.    Dispense:  12 tablet    Refill:  0    Order Specific Question:  Supervising Provider    Answer:  Eber Hong [[id number]]    I personally performed the services in this documentation, which was scribed in my presence.  The recorded information has been reviewed and considered.   Barnet Pall.    Lonia Skinner [place], PA-C 11/08/14 1540  Lyndal Pulley, MD 11/08/14 [phone]

## 2014-11-08 NOTE — Discharge Instructions (Signed)
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
